# Patient Record
Sex: Male | Born: 1955 | Race: White | Hispanic: No | Marital: Married | Smoking: Current some day smoker
Health system: Southern US, Community
[De-identification: ages and names within clinical notes are randomized; demographics above are authoritative.]

## PROBLEM LIST (undated history)

## (undated) DIAGNOSIS — H269 Unspecified cataract: Secondary | ICD-10-CM

## (undated) HISTORY — PX: TESTICLE BIOPSY: SHX471

## (undated) HISTORY — DX: Unspecified cataract: H26.9

## (undated) HISTORY — PX: COLONOSCOPY: SHX174

## (undated) HISTORY — PX: CATARACT EXTRACTION: SUR2

---

## 2010-08-17 ENCOUNTER — Encounter: Payer: Self-pay | Admitting: Gastroenterology

## 2012-03-12 DIAGNOSIS — H269 Unspecified cataract: Secondary | ICD-10-CM

## 2012-03-12 HISTORY — DX: Unspecified cataract: H26.9

## 2012-04-23 ENCOUNTER — Emergency Department (HOSPITAL_COMMUNITY)
Admission: EM | Admit: 2012-04-23 | Discharge: 2012-04-23 | Disposition: A | Discharge status: Home / Self Care | Payer: BC Managed Care – PPO | Attending: Emergency Medicine | Admitting: Emergency Medicine

## 2012-04-23 ENCOUNTER — Emergency Department (HOSPITAL_COMMUNITY): Payer: BC Managed Care – PPO

## 2012-04-23 ENCOUNTER — Encounter (HOSPITAL_COMMUNITY): Payer: Self-pay | Admitting: *Deleted

## 2012-04-23 DIAGNOSIS — W010XXA Fall on same level from slipping, tripping and stumbling without subsequent striking against object, initial encounter: Secondary | ICD-10-CM | POA: Insufficient documentation

## 2012-04-23 DIAGNOSIS — M25532 Pain in left wrist: Secondary | ICD-10-CM

## 2012-04-23 DIAGNOSIS — S59909A Unspecified injury of unspecified elbow, initial encounter: Secondary | ICD-10-CM | POA: Insufficient documentation

## 2012-04-23 DIAGNOSIS — Y939 Activity, unspecified: Secondary | ICD-10-CM | POA: Insufficient documentation

## 2012-04-23 DIAGNOSIS — S6990XA Unspecified injury of unspecified wrist, hand and finger(s), initial encounter: Secondary | ICD-10-CM | POA: Insufficient documentation

## 2012-04-23 DIAGNOSIS — Y929 Unspecified place or not applicable: Secondary | ICD-10-CM | POA: Insufficient documentation

## 2012-04-23 MED ORDER — MELOXICAM 15 MG PO TABS: 15.0000 mg | ORAL_TABLET | Freq: Every day | ORAL | Status: AC

## 2012-04-23 NOTE — ED Notes (Signed)
Pt c/o  Lt wrist pain after he fell on a piece of ice today

## 2012-04-23 NOTE — ED Provider Notes (Signed)
History    This chart was scribed for non-physician practitioner working with Carleene Cooper III, MD by Sofie Rower, ED Scribe. This patient was seen in room TR08C/TR08C and the patient's care was started at 7:46PM.    CSN: [id number]  Arrival date & time 04/23/12  1191   First MD Initiated Contact with Patient 04/23/12 1946      Chief Complaint  Patient presents with  . Wrist Pain    (Consider location/radiation/quality/duration/timing/severity/associated sxs/prior treatment) The history is provided by the patient. No language interpreter was used.    Steven Burgess is a 57 y.o. male , who presents to the Emergency Department complaining of sudden, progressively worsening, non radiating wrist pain located at the left wrist, onset today (04/23/12). The pt reports he was exiting his truck earlier this afternoon, where he suddenly slipped and fell on a large sheet of ice, impacting directly upon his left wrist. Two hours after the falling incident, the pt began to experience a throbbing pain within his left wrist, generating his concern and desire to seek medical evaluation at [facility] this evening. The pt has not taken any medications to relieve his left wrist pain PTA. Modifying factors include certain movements and positions of the left wrist which intensifies the pain.  The pt denies left elbow pain, left shoulder pain, abdominal pain, and back pain. Furthermore, the pt denies any hx of major medical problems.  The pt does not smoke or drink alcohol.   Pt does not have an Orthopedist at present time.    History reviewed. No pertinent past medical history.  History reviewed. No pertinent past surgical history.  No family history on file.  History  Substance Use Topics  . Smoking status: Never Smoker   . Smokeless tobacco: Not on file  . Alcohol Use: No      Review of Systems  10 Systems reviewed and all are negative for acute change except as noted in the HPI.    Allergies   Review of patient's allergies indicates not on file.  Home Medications  No current outpatient prescriptions on file.  BP 131/89  Pulse 62  Temp(Src) 97.8 F (36.6 C) (Oral)  SpO2 98%  Physical Exam  Nursing note and vitals reviewed. Constitutional: He is oriented to person, place, and time. He appears well-developed and well-nourished. No distress.  HENT:  Head: Normocephalic and atraumatic.  Eyes: EOM are normal.  Neck: Neck supple. No tracheal deviation present.  Cardiovascular: Normal rate, regular rhythm and normal heart sounds.  Exam reveals no gallop and no friction rub.   No murmur heard. Pulmonary/Chest: Effort normal and breath sounds normal. No respiratory distress.  Abdominal: Soft. Bowel sounds are normal. There is no tenderness.  Musculoskeletal: Normal range of motion.       Left wrist: He exhibits bony tenderness.  Left wrist: Swelling and tenderness on the dorsolateral surface of the left wrist. No ecchymosis. No acute deformity present. X-ray reveals NML results. Full ROM of the left wrist with pain with wrist inversion. Pain with abduction and adduction of the 5th digit. No elbow tenderness. Full ROM, flexion, extension, supination, pronation are normal. Pt presents with a minor discomfort which has resolved.   Neurological: He is alert and oriented to person, place, and time.  Skin: Skin is warm and dry.  Psychiatric: He has a normal mood and affect. His behavior is normal.    ED Course  Procedures (including critical care time)  DIAGNOSTIC STUDIES: Oxygen Saturation is 98%  on room air, normal by my interpretation.    COORDINATION OF CARE:  8:15 PM- Treatment plan discussed with patient. Pt agrees with treatment.      Labs Reviewed - No data to display Dg Wrist Complete Left  04/23/2012  *RADIOLOGY REPORT*  Clinical Data: Fall on ice.  Pain.  LEFT WRIST - COMPLETE 3+ VIEW  Comparison: None available.  Findings: Left wrist is located.  No acute bone or  soft tissue abnormalities present.  IMPRESSION: Negative left wrist.   Original Report Authenticated By: Marin Roberts, M.D.      1. Wrist pain, acute, left       MDM  Patient without acute fracture or dislocation.  Wrist splint,  Supportive care and antiinflammatory given. Return precuations discussed and ortho follow up.    I personally performed the services described in this documentation, which was scribed in my presence. The recorded information has been reviewed and is accurate.     Arthor Captain, PA-C 04/23/12 2137

## 2012-04-23 NOTE — Progress Notes (Signed)
Orthopedic Tech Progress Note Patient Details:  Steven Burgess January 30, 1956 [id number]  Ortho Devices Type of Ortho Device: Velcro wrist splint Ortho Device/Splint Location: left wrist Ortho Device/Splint Interventions: Application   Nikki Dom 04/23/2012, 8:29 PM

## 2012-04-24 NOTE — ED Provider Notes (Signed)
Medical screening examination/treatment/procedure(s) were performed by non-physician practitioner and as supervising physician I was immediately available for consultation/collaboration.   Ronzell Laban III, MD 04/24/12 1110 

## 2013-05-29 ENCOUNTER — Encounter: Payer: Self-pay | Admitting: Gastroenterology

## 2013-06-12 IMAGING — CR DG WRIST COMPLETE 3+V*L*
4 series · 4 of 4 positions shown · non-contrast
Comparison: None available.

CLINICAL DATA: Fall on ice.  Pain.

LEFT WRIST - COMPLETE 3+ VIEW

[x wrist pa left]
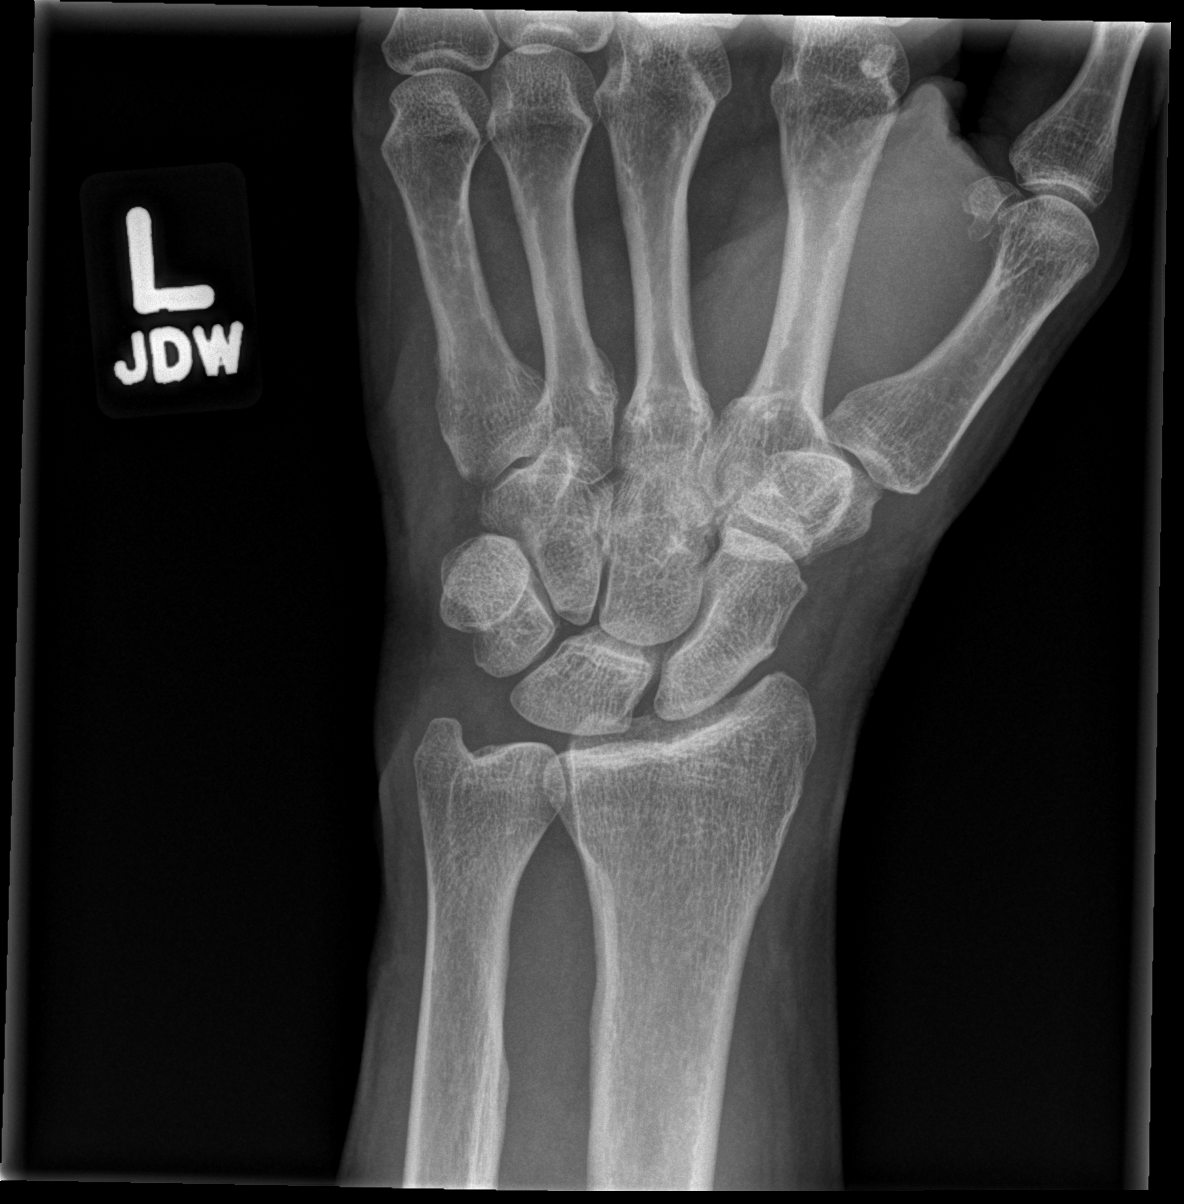

[x wrist obl left]
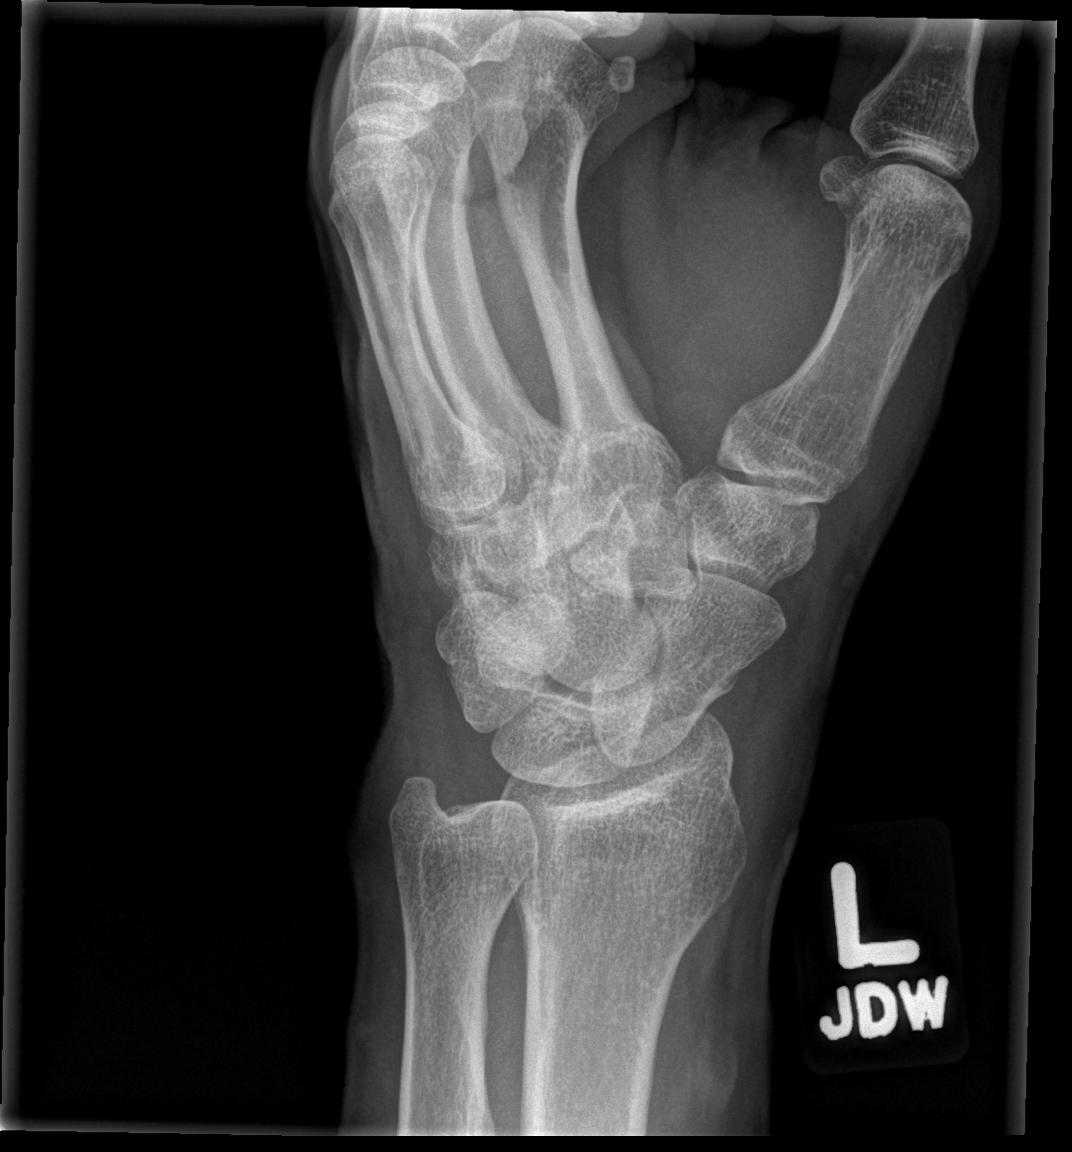

[x wrist lat left]
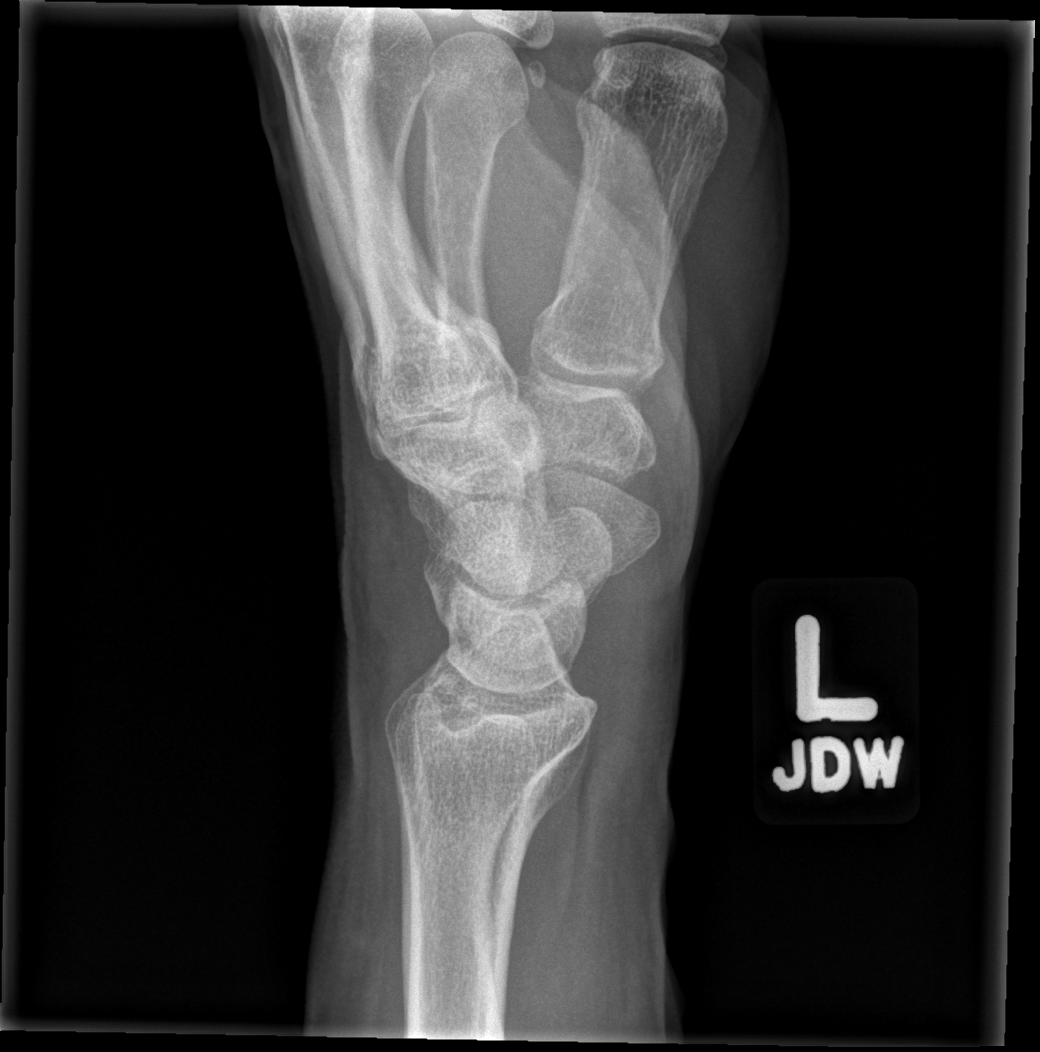

[x wrist navicular view left]
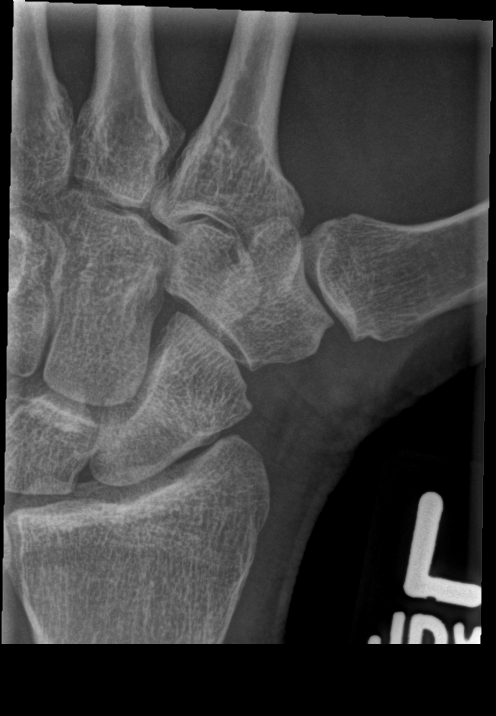

[4 of 4 positions shown; findings below may reference images not displayed]

FINDINGS: Left wrist is located.  No acute bone or soft tissue
abnormalities present.
IMPRESSION: Negative left wrist.

## 2014-05-14 ENCOUNTER — Encounter: Payer: Self-pay | Admitting: Gastroenterology

## 2014-09-09 ENCOUNTER — Ambulatory Visit (INDEPENDENT_AMBULATORY_CARE_PROVIDER_SITE_OTHER): Payer: BLUE CROSS/BLUE SHIELD | Admitting: Physician Assistant

## 2014-09-09 VITALS — BP 108/78 | HR 65 | Temp 97.4°F | Resp 16 | Ht 75.0 in | Wt 205.2 lb

## 2014-09-09 DIAGNOSIS — J069 Acute upper respiratory infection, unspecified: Secondary | ICD-10-CM | POA: Diagnosis not present

## 2014-09-09 MED ORDER — IPRATROPIUM BROMIDE 0.03 % NA SOLN: 2.0000 | Freq: Two times a day (BID) | NASAL | Status: AC

## 2014-09-09 MED ORDER — BENZONATATE 100 MG PO CAPS: 100.0000 mg | ORAL_CAPSULE | Freq: Three times a day (TID) | ORAL | Status: AC | PRN

## 2014-09-09 NOTE — Progress Notes (Signed)
   Subjective:    Patient ID: Steven Burgess, male    DOB: 03-Aug-1955, 59 y.o.   MRN: [id number]  HPI Patient presents for nasal congestion and cough that has been present for past 8 days. Says that he was feeling a little better over the weekend, but then developed a fever of 101 degrees at the beginning of the week. Cough has been sometimes productive. Endorses rhinorrhea, but denies sinus pressure, HA, otalgia, SOB/CP, N/V, sneezing, or wheezing. Denies h/o asthma or allergies. Smokes cigars occasionally. No known sick contacts. NKDA.   Review of Systems As noted above.    Objective:   Physical Exam  Constitutional: He is oriented to person, place, and time. He appears well-developed and well-nourished. No distress.  Blood pressure 108/78, pulse 65, temperature 97.4 F (36.3 C), temperature source Oral, resp. rate 16, height 6\' 3"  (1.905 m), weight 205 lb 3.2 oz (93.078 kg), SpO2 100 %.  HENT:  Head: Normocephalic and atraumatic.  Right Ear: Tympanic membrane, external ear and ear canal normal.  Left Ear: Tympanic membrane, external ear and ear canal normal.  Nose: Rhinorrhea (with moderate erythema) present. No mucosal edema. Right sinus exhibits no maxillary sinus tenderness and no frontal sinus tenderness. Left sinus exhibits no maxillary sinus tenderness and no frontal sinus tenderness.  Mouth/Throat: Uvula is midline, oropharynx is clear and moist and mucous membranes are normal. No oropharyngeal exudate, posterior oropharyngeal edema or posterior oropharyngeal erythema.  Eyes: Conjunctivae are normal. Pupils are equal, round, and reactive to light. Right eye exhibits no discharge. Left eye exhibits no discharge. No scleral icterus.  Neck: Normal range of motion. Neck supple. No thyromegaly present.  Cardiovascular: Normal rate, regular rhythm and normal heart sounds.  Exam reveals no gallop and no friction rub.   No murmur heard. Pulmonary/Chest: Effort normal and breath sounds  normal. No respiratory distress. He has no wheezes. He has no rales. He exhibits no tenderness.  Abdominal: Soft. Bowel sounds are normal. He exhibits no distension and no mass. There is no tenderness. There is no rebound and no guarding.  Lymphadenopathy:    He has no cervical adenopathy.  Neurological: He is alert and oriented to person, place, and time.  Skin: Skin is warm and dry. No rash noted. He is not diaphoretic. No erythema. No pallor.       Assessment & Plan:  1. Acute upper respiratory infection Plenty of water and rest. - benzonatate (TESSALON) 100 MG capsule; Take 1-2 capsules (100-200 mg total) by mouth 3 (three) times daily as needed for cough.  Dispense: 40 capsule; Refill: 0 - ipratropium (ATROVENT) 0.03 % nasal spray; Place 2 sprays into both nostrils 2 (two) times daily.  Dispense: 30 mL; Refill: 0   Luis Sami PA-C  [facility] 09/09/2014 1:09 PM

## 2014-09-09 NOTE — Patient Instructions (Signed)
Upper Respiratory Infection, Adult An upper respiratory infection (URI) is also sometimes known as the common cold. The upper respiratory tract includes the nose, sinuses, throat, trachea, and bronchi. Bronchi are the airways leading to the lungs. Most people improve within 1 week, but symptoms can last up to 2 weeks. A residual cough may last even longer.  CAUSES Many different viruses can infect the tissues lining the upper respiratory tract. The tissues become irritated and inflamed and often become very moist. Mucus production is also common. A cold is contagious. You can easily spread the virus to others by oral contact. This includes kissing, sharing a glass, coughing, or sneezing. Touching your mouth or nose and then touching a surface, which is then touched by another person, can also spread the virus. SYMPTOMS  Symptoms typically develop 1 to 3 days after you come in contact with a cold virus. Symptoms vary from person to person. They may include:  Runny nose.  Sneezing.  Nasal congestion.  Sinus irritation.  Sore throat.  Loss of voice (laryngitis).  Cough.  Fatigue.  Muscle aches.  Loss of appetite.  Headache.  Low-grade fever. DIAGNOSIS  You might diagnose your own cold based on familiar symptoms, since most people get a cold 2 to 3 times a year. Your caregiver can confirm this based on your exam. Most importantly, your caregiver can check that your symptoms are not due to another disease such as strep throat, sinusitis, pneumonia, asthma, or epiglottitis. Blood tests, throat tests, and X-rays are not necessary to diagnose a common cold, but they may sometimes be helpful in excluding other more serious diseases. Your caregiver will decide if any further tests are required. RISKS AND COMPLICATIONS  You may be at risk for a more severe case of the common cold if you smoke cigarettes, have chronic heart disease (such as heart failure) or lung disease (such as asthma), or if  you have a weakened immune system. The very young and very old are also at risk for more serious infections. Bacterial sinusitis, middle ear infections, and bacterial pneumonia can complicate the common cold. The common cold can worsen asthma and chronic obstructive pulmonary disease (COPD). Sometimes, these complications can require emergency medical care and may be life-threatening. PREVENTION  The best way to protect against getting a cold is to practice good hygiene. Avoid oral or hand contact with people with cold symptoms. Wash your hands often if contact occurs. There is no clear evidence that vitamin C, vitamin E, echinacea, or exercise reduces the chance of developing a cold. However, it is always recommended to get plenty of rest and practice good nutrition. TREATMENT  Treatment is directed at relieving symptoms. There is no cure. Antibiotics are not effective, because the infection is caused by a virus, not by bacteria. Treatment may include:  Increased fluid intake. Sports drinks offer valuable electrolytes, sugars, and fluids.  Breathing heated mist or steam (vaporizer or shower).  Eating chicken soup or other clear broths, and maintaining good nutrition.  Getting plenty of rest.  Using gargles or lozenges for comfort.  Controlling fevers with ibuprofen or acetaminophen as directed by your caregiver.  Increasing usage of your inhaler if you have asthma. Zinc gel and zinc lozenges, taken in the first 24 hours of the common cold, can shorten the duration and lessen the severity of symptoms. Pain medicines may help with fever, muscle aches, and throat pain. A variety of non-prescription medicines are available to treat congestion and runny nose. Your caregiver   can make recommendations and may suggest nasal or lung inhalers for other symptoms.  HOME CARE INSTRUCTIONS   Only take over-the-counter or prescription medicines for pain, discomfort, or fever as directed by your  caregiver.  Use a warm mist humidifier or inhale steam from a shower to increase air moisture. This may keep secretions moist and make it easier to breathe.  Drink enough water and fluids to keep your urine clear or pale yellow.  Rest as needed.  Return to work when your temperature has returned to normal or as your caregiver advises. You may need to stay home longer to avoid infecting others. You can also use a face mask and careful hand washing to prevent spread of the virus. SEEK MEDICAL CARE IF:   After the first few days, you feel you are getting worse rather than better.  You need your caregiver's advice about medicines to control symptoms.  You develop chills, worsening shortness of breath, or brown or red sputum. These may be signs of pneumonia.  You develop yellow or brown nasal discharge or pain in the face, especially when you bend forward. These may be signs of sinusitis.  You develop a fever, swollen neck glands, pain with swallowing, or white areas in the back of your throat. These may be signs of strep throat. SEEK IMMEDIATE MEDICAL CARE IF:   You have a fever.  You develop severe or persistent headache, ear pain, sinus pain, or chest pain.  You develop wheezing, a prolonged cough, cough up blood, or have a change in your usual mucus (if you have chronic lung disease).  You develop sore muscles or a stiff neck. Document Released: 08/22/2000 Document Revised: 05/21/2011 Document Reviewed: 06/03/2013 ExitCare Patient Information 2015 [employer]. This information is not intended to replace advice given to you by your health care provider. Make sure you discuss any questions you have with your health care provider.  

## 2014-09-11 NOTE — Progress Notes (Signed)
  Medical screening examination/treatment/procedure(s) were performed by non-physician practitioner and as supervising physician I was immediately available for consultation/collaboration.     

## 2015-12-21 ENCOUNTER — Encounter: Payer: Self-pay | Admitting: Gastroenterology

## 2016-01-24 ENCOUNTER — Ambulatory Visit (AMBULATORY_SURGERY_CENTER): Payer: Self-pay

## 2016-01-24 VITALS — Ht 75.0 in | Wt 208.4 lb

## 2016-01-24 DIAGNOSIS — Z1211 Encounter for screening for malignant neoplasm of colon: Secondary | ICD-10-CM

## 2016-01-24 MED ORDER — NA SULFATE-K SULFATE-MG SULF 17.5-3.13-1.6 GM/177ML PO SOLN: ORAL | 0 refills | Status: DC

## 2016-01-24 NOTE — Progress Notes (Signed)
Per pt, no allergies to soy or egg products.Pt not taking any weight loss meds or using  O2 at home. 

## 2016-01-31 ENCOUNTER — Encounter: Payer: Self-pay | Admitting: Gastroenterology

## 2016-02-14 ENCOUNTER — Ambulatory Visit (AMBULATORY_SURGERY_CENTER): Payer: BLUE CROSS/BLUE SHIELD | Admitting: Gastroenterology

## 2016-02-14 ENCOUNTER — Encounter: Payer: Self-pay | Admitting: Gastroenterology

## 2016-02-14 VITALS — BP 121/68 | HR 63 | Temp 96.9°F | Resp 8 | Ht 75.0 in | Wt 208.0 lb

## 2016-02-14 DIAGNOSIS — Z1211 Encounter for screening for malignant neoplasm of colon: Secondary | ICD-10-CM

## 2016-02-14 DIAGNOSIS — Z1212 Encounter for screening for malignant neoplasm of rectum: Secondary | ICD-10-CM | POA: Diagnosis not present

## 2016-02-14 MED ORDER — SODIUM CHLORIDE 0.9 % IV SOLN: 500.0000 mL | INTRAVENOUS | Status: AC

## 2016-02-14 NOTE — Patient Instructions (Signed)
YOU HAD AN ENDOSCOPIC PROCEDURE TODAY AT [facility]:   Refer to the procedure report that was given to you for any specific questions about what was found during the examination.  If the procedure report does not answer your questions, please call your gastroenterologist to clarify.  If you requested that your care partner not be given the details of your procedure findings, then the procedure report has been included in a sealed envelope for you to review at your convenience later.  YOU SHOULD EXPECT: Some feelings of bloating in the abdomen. Passage of more gas than usual.  Walking can help get rid of the air that was put into your GI tract during the procedure and reduce the bloating. If you had a lower endoscopy (such as a colonoscopy or flexible sigmoidoscopy) you may notice spotting of blood in your stool or on the toilet paper. If you underwent a bowel prep for your procedure, you may not have a normal bowel movement for a few days.  Please Note:  You might notice some irritation and congestion in your nose or some drainage.  This is from the oxygen used during your procedure.  There is no need for concern and it should clear up in a day or so.  SYMPTOMS TO REPORT IMMEDIATELY:   Following lower endoscopy (colonoscopy or flexible sigmoidoscopy):  Excessive amounts of blood in the stool  Significant tenderness or worsening of abdominal pains  Swelling of the abdomen that is new, acute  Fever of 100F or higher  For urgent or emergent issues, a gastroenterologist can be reached at any hour by calling [phone].   DIET:  We do recommend a small meal at first, but then you may proceed to your regular diet.  Drink plenty of fluids but you should avoid alcoholic beverages for 24 hours.  ACTIVITY:  You should plan to take it easy for the rest of today and you should NOT DRIVE or use heavy machinery until tomorrow (because of the sedation medicines used during the test).     FOLLOW UP: Our staff will call the number listed on your records the next business day following your procedure to check on you and address any questions or concerns that you may have regarding the information given to you following your procedure. If we do not reach you, we will leave a message.  However, if you are feeling well and you are not experiencing any problems, there is no need to return our call.  We will assume that you have returned to your regular daily activities without incident.  Repeat Colonoscopy in 10 years 2027  If any biopsies were taken you will be contacted by phone or by letter within the next 1-3 weeks.  Please call us at [phone] if you have not heard about the biopsies in 3 weeks.    SIGNATURES/CONFIDENTIALITY: You and/or your care partner have signed paperwork which will be entered into your electronic medical record.  These signatures attest to the fact that that the information above on your After Visit Summary has been reviewed and is understood.  Full responsibility of the confidentiality of this discharge information lies with you and/or your care-partner.

## 2016-02-14 NOTE — Op Note (Signed)
[facility] Patient Name: Steven Burgess Procedure Date: 02/14/2016 10:26 AM MRN: [id number] Endoscopist: Mallie Mussel L. Loletha Carrow , MD Age: 60 Referring MD:  Date of Birth: [birth year] Gender: Male Account #: [id number] Procedure:                Colonoscopy Indications:              Screening for colorectal malignant neoplasm (no                            polyps on colonoscopy 07/2003) Medicines:                Monitored Anesthesia Care Procedure:                Pre-Anesthesia Assessment:                           - Prior to the procedure, a History and Physical                            was performed, and patient medications and                            allergies were reviewed. The patient's tolerance of                            previous anesthesia was also reviewed. The risks                            and benefits of the procedure and the sedation                            options and risks were discussed with the patient.                            All questions were answered, and informed consent                            was obtained. Prior Anticoagulants: The patient has                            taken no previous anticoagulant or antiplatelet                            agents. ASA Grade Assessment: I - A normal, healthy                            patient. After reviewing the risks and benefits,                            the patient was deemed in satisfactory condition to                            undergo the procedure.  After obtaining informed consent, the colonoscope                            was passed under direct vision. Throughout the                            procedure, the patient's blood pressure, pulse, and                            oxygen saturations were monitored continuously. The                            Model CF-HQ190L [id number]) scope was introduced                            through the anus and advanced to the the cecum,                             identified by appendiceal orifice and ileocecal                            valve. The colonoscopy was performed without                            difficulty. The patient tolerated the procedure                            well. The quality of the bowel preparation was                            excellent. The ileocecal valve, appendiceal                            orifice, and rectum were photographed. The quality                            of the bowel preparation was evaluated using the                            BBPS [facility] Bowel Preparation Scale) with scores                            of: Right Colon = 3, Transverse Colon = 3 and Left                            Colon = 3 (entire mucosa seen well with no residual                            staining, small fragments of stool or opaque                            liquid). The total BBPS score equals 9. The bowel  preparation used was SUPREP. Scope In: 10:46:33 AM Scope Out: 11:00:25 AM Scope Withdrawal Time: 0 hours 8 minutes 45 seconds  Total Procedure Duration: 0 hours 13 minutes 52 seconds  Findings:                 The perianal and digital rectal examinations were                            normal.                           A few medium-mouthed diverticula were found in the                            left colon.                           Internal hemorrhoids were found during retroflexion                            and during anoscopy. The hemorrhoids were                            medium-sized and Grade I (internal hemorrhoids that                            do not prolapse).                           The exam was otherwise without abnormality on                            direct and retroflexion views. Complications:            No immediate complications. Estimated Blood Loss:     Estimated blood loss: none. Impression:               - Diverticulosis in the left colon.                            - Internal hemorrhoids.                           - The examination was otherwise normal on direct                            and retroflexion views.                           - No specimens collected. Recommendation:           - Patient has a contact number available for                            emergencies. The signs and symptoms of potential                            delayed complications were discussed with the  patient. Return to normal activities tomorrow.                            Written discharge instructions were provided to the                            patient.                           - Resume previous diet.                           - Continue present medications.                           - Repeat colonoscopy in 10 years for screening                            purposes. Tomaz Janis L. Loletha Carrow, MD 02/14/2016 11:04:33 AM This report has been signed electronically.

## 2016-02-14 NOTE — Progress Notes (Signed)
Report given to PACU RN, vss 

## 2016-02-15 ENCOUNTER — Telehealth: Payer: Self-pay

## 2016-02-15 ENCOUNTER — Telehealth: Payer: Self-pay | Admitting: *Deleted

## 2016-02-15 NOTE — Telephone Encounter (Signed)
Message left

## 2016-02-15 NOTE — Telephone Encounter (Signed)
  Follow up Call-  Call back number 02/14/2016  Post procedure Call Back phone  # [phone]  Permission to leave phone message Yes  Some recent data might be hidden     Patient questions:  Do you have a fever, pain , or abdominal swelling? No. Pain Score  0 *  Have you tolerated food without any problems? Yes.    Have you been able to return to your normal activities? Yes.    Do you have any questions about your discharge instructions: Diet   No. Medications  No. Follow up visit  No.  Do you have questions or concerns about your Care? No.  Actions: * If pain score is 4 or above: No action needed, pain <4.

## 2018-05-21 DIAGNOSIS — H9193 Unspecified hearing loss, bilateral: Secondary | ICD-10-CM | POA: Diagnosis not present

## 2018-05-21 DIAGNOSIS — Z0000 Encounter for general adult medical examination without abnormal findings: Secondary | ICD-10-CM | POA: Diagnosis not present

## 2018-05-21 DIAGNOSIS — Z125 Encounter for screening for malignant neoplasm of prostate: Secondary | ICD-10-CM | POA: Diagnosis not present

## 2018-05-21 DIAGNOSIS — Z23 Encounter for immunization: Secondary | ICD-10-CM | POA: Diagnosis not present

## 2018-05-21 DIAGNOSIS — Z1159 Encounter for screening for other viral diseases: Secondary | ICD-10-CM | POA: Diagnosis not present

## 2018-05-21 DIAGNOSIS — E782 Mixed hyperlipidemia: Secondary | ICD-10-CM | POA: Diagnosis not present

## 2019-04-20 ENCOUNTER — Ambulatory Visit: Payer: Medicare Other | Attending: Internal Medicine

## 2019-04-20 DIAGNOSIS — Z23 Encounter for immunization: Secondary | ICD-10-CM

## 2019-04-20 NOTE — Progress Notes (Signed)
   Covid-19 Vaccination Clinic  Name:  Steven Burgess    MRN: [id number] DOB: 08-20-1955  04/20/2019  [name] was observed post Covid-19 immunization for 15 minutes without incidence. He was provided with Vaccine Information Sheet and instruction to access the V-Safe system.   [name] was instructed to call 911 with any severe reactions post vaccine: Marland Kitchen Difficulty breathing  . Swelling of your face and throat  . A fast heartbeat  . A bad rash all over your body  . Dizziness and weakness    Immunizations Administered    Name Date Dose VIS Date Route   Pfizer COVID-19 Vaccine 04/20/2019  4:50 PM 0.3 mL 02/20/2019 Intramuscular   Manufacturer: [place]   Lot: [id number]   [place]: [id number]

## 2019-05-15 ENCOUNTER — Ambulatory Visit: Payer: Medicare Other | Attending: Internal Medicine

## 2019-05-15 ENCOUNTER — Other Ambulatory Visit: Payer: Self-pay

## 2019-05-15 DIAGNOSIS — Z23 Encounter for immunization: Secondary | ICD-10-CM

## 2019-05-15 NOTE — Progress Notes (Signed)
   Covid-19 Vaccination Clinic  Name:  Steven Burgess    MRN: [id number] DOB: [birth year]  05/15/2019  [name] was observed post Covid-19 immunization for 15 minutes without incident. He was provided with Vaccine Information Sheet and instruction to access the V-Safe system.   [name] was instructed to call 911 with any severe reactions post vaccine: Marland Kitchen Difficulty breathing  . Swelling of face and throat  . A fast heartbeat  . A bad rash all over body  . Dizziness and weakness   Immunizations Administered    Name Date Dose VIS Date Route   Pfizer COVID-19 Vaccine 05/15/2019  2:48 PM 0.3 mL 02/20/2019 Intramuscular   Manufacturer: [place]   Lot: [id number]   [place]: [id number]

## 2019-05-28 DIAGNOSIS — Z7189 Other specified counseling: Secondary | ICD-10-CM | POA: Diagnosis not present

## 2019-05-28 DIAGNOSIS — E782 Mixed hyperlipidemia: Secondary | ICD-10-CM | POA: Diagnosis not present

## 2019-05-28 DIAGNOSIS — M67431 Ganglion, right wrist: Secondary | ICD-10-CM | POA: Diagnosis not present

## 2019-05-28 DIAGNOSIS — Z125 Encounter for screening for malignant neoplasm of prostate: Secondary | ICD-10-CM | POA: Diagnosis not present

## 2019-05-28 DIAGNOSIS — Z0000 Encounter for general adult medical examination without abnormal findings: Secondary | ICD-10-CM | POA: Diagnosis not present

## 2019-05-28 DIAGNOSIS — F419 Anxiety disorder, unspecified: Secondary | ICD-10-CM | POA: Diagnosis not present

## 2019-07-30 DIAGNOSIS — R2231 Localized swelling, mass and lump, right upper limb: Secondary | ICD-10-CM | POA: Diagnosis not present

## 2019-07-30 DIAGNOSIS — M25531 Pain in right wrist: Secondary | ICD-10-CM | POA: Diagnosis not present

## 2019-07-30 DIAGNOSIS — M67431 Ganglion, right wrist: Secondary | ICD-10-CM | POA: Diagnosis not present

## 2019-08-14 DIAGNOSIS — S46299A Other injury of muscle, fascia and tendon of other parts of biceps, unspecified arm, initial encounter: Secondary | ICD-10-CM | POA: Diagnosis not present

## 2019-08-14 DIAGNOSIS — D2111 Benign neoplasm of connective and other soft tissue of right upper limb, including shoulder: Secondary | ICD-10-CM | POA: Diagnosis not present

## 2019-08-14 DIAGNOSIS — G8918 Other acute postprocedural pain: Secondary | ICD-10-CM | POA: Diagnosis not present

## 2019-08-14 DIAGNOSIS — D481 Neoplasm of uncertain behavior of connective and other soft tissue: Secondary | ICD-10-CM | POA: Diagnosis not present

## 2019-12-10 DIAGNOSIS — Z23 Encounter for immunization: Secondary | ICD-10-CM | POA: Diagnosis not present

## 2020-04-15 DIAGNOSIS — B029 Zoster without complications: Secondary | ICD-10-CM | POA: Diagnosis not present

## 2020-07-05 DIAGNOSIS — Z20822 Contact with and (suspected) exposure to covid-19: Secondary | ICD-10-CM | POA: Diagnosis not present

## 2021-04-25 DIAGNOSIS — H25041 Posterior subcapsular polar age-related cataract, right eye: Secondary | ICD-10-CM | POA: Diagnosis not present

## 2021-12-27 DIAGNOSIS — Z23 Encounter for immunization: Secondary | ICD-10-CM | POA: Diagnosis not present

## 2022-02-07 DIAGNOSIS — Z23 Encounter for immunization: Secondary | ICD-10-CM | POA: Diagnosis not present

## 2022-05-08 DIAGNOSIS — Z125 Encounter for screening for malignant neoplasm of prostate: Secondary | ICD-10-CM | POA: Diagnosis not present

## 2022-05-08 DIAGNOSIS — Z131 Encounter for screening for diabetes mellitus: Secondary | ICD-10-CM | POA: Diagnosis not present

## 2022-05-08 DIAGNOSIS — Z0000 Encounter for general adult medical examination without abnormal findings: Secondary | ICD-10-CM | POA: Diagnosis not present

## 2022-05-08 DIAGNOSIS — E7800 Pure hypercholesterolemia, unspecified: Secondary | ICD-10-CM | POA: Diagnosis not present

## 2022-05-08 DIAGNOSIS — Z13220 Encounter for screening for lipoid disorders: Secondary | ICD-10-CM | POA: Diagnosis not present

## 2022-05-08 DIAGNOSIS — L989 Disorder of the skin and subcutaneous tissue, unspecified: Secondary | ICD-10-CM | POA: Diagnosis not present

## 2022-05-08 DIAGNOSIS — Z23 Encounter for immunization: Secondary | ICD-10-CM | POA: Diagnosis not present

## 2022-05-09 ENCOUNTER — Other Ambulatory Visit: Payer: Self-pay | Admitting: Family Medicine

## 2022-05-09 DIAGNOSIS — E7800 Pure hypercholesterolemia, unspecified: Secondary | ICD-10-CM

## 2022-05-30 ENCOUNTER — Ambulatory Visit (INDEPENDENT_AMBULATORY_CARE_PROVIDER_SITE_OTHER): Payer: Medicare Other | Admitting: Dermatology

## 2022-05-30 ENCOUNTER — Encounter: Payer: Self-pay | Admitting: Dermatology

## 2022-05-30 DIAGNOSIS — L720 Epidermal cyst: Secondary | ICD-10-CM

## 2022-05-30 DIAGNOSIS — Z1283 Encounter for screening for malignant neoplasm of skin: Secondary | ICD-10-CM | POA: Diagnosis not present

## 2022-05-30 DIAGNOSIS — L821 Other seborrheic keratosis: Secondary | ICD-10-CM | POA: Diagnosis not present

## 2022-05-30 DIAGNOSIS — D1800 Hemangioma unspecified site: Secondary | ICD-10-CM

## 2022-05-30 NOTE — Progress Notes (Signed)
   New Patient Visit  Subjective  Steven Burgess is a 67 y.o. male who presents for the following: Annual Exam (C/o occasionally the left side of nose will get a pimple type bump. Comes and goes. //Skin on scalp is dry and itchy. Plays golf often but not too good at applying sunscreen. But wears hat //Red skin tags on chest, noticed a few brown and textured spots on neck. Last dermatology appointment about 10 years ).    Objective  Well appearing patient in no apparent distress; mood and affect are within normal limits.  A full examination was performed including scalp, head, eyes, ears, nose, lips, neck, chest, axillae, abdomen, back, buttocks, bilateral upper extremities, bilateral lower extremities, hands, feet, fingers, toes, fingernails, and toenails. All findings within normal limits unless otherwise noted below.  A dermatoscope was used during the exam.  The following people were also present during my examination: , my medical assistant (male)   Left Forearm - Anterior Stuck-on verrucous, tan-brown papules and plaques.  Mid Tip of Nose Red papule   Assessment & Plan  Seborrheic keratosis Left Forearm - Anterior  I counseled the patient regarding the following: Skin Care: Seborrheic Keratoses are benign. No treatment is necessary. Expectations: Seborrheic Keratoses are benign warty growths. Patients get more of them as they age.   Hemangioma, unspecified site Mid Tip of Nose  Electrodesiccation of lesion on nose    Lentigines - Scattered tan macules - Due to sun exposure - Benign-appearing, observe - Recommend daily broad spectrum sunscreen SPF 30+ to sun-exposed areas, reapply every 2 hours as needed. - Call for any changes  Seborrheic Keratoses - Stuck-on, waxy, tan-brown papules and/or plaques  - Benign-appearing - Discussed benign etiology and prognosis. - Observe - Call for any changes  Melanocytic Nevi - Tan-brown and/or pink-flesh-colored  symmetric macules and papules - Benign appearing on exam today - Observation - Call clinic for new or changing moles - Recommend daily use of broad spectrum spf 30+ sunscreen to sun-exposed areas.   Hemangiomas - Red papules - Discussed benign nature - Observe - Call for any changes  Actinic Damage - Chronic condition, secondary to cumulative UV/sun exposure - diffuse scaly erythematous macules with underlying dyspigmentation - Recommend daily broad spectrum sunscreen SPF 30+ to sun-exposed areas, reapply every 2 hours as needed.  - Staying in the shade or wearing long sleeves, sun glasses (UVA+UVB protection) and wide brim hats (4-inch brim around the entire circumference of the hat) are also recommended for sun protection.  - Call for new or changing lesions.  Skin cancer screening performed today.  Return in about 1 year (around 05/30/2023) for Skin check.  Documentation: I have reviewed the above documentation for accuracy and completeness, and I agree with the above  [place], DO

## 2022-05-30 NOTE — Patient Instructions (Addendum)
Due to recent changes in healthcare laws, you may see results of your pathology and/or laboratory studies on MyChart before the doctors have had a chance to review them. We understand that in some cases there may be results that are confusing or concerning to you. Please understand that not all results are received at the same time and often the doctors may need to interpret multiple results in order to provide you with the best plan of care or course of treatment. Therefore, we ask that you please give [place] 2 business days to thoroughly review all your results before contacting the office for clarification. Should we see a critical lab result, you will be contacted sooner.   If You Need Anything After Your Visit  If you have any questions or concerns for your doctor, please call our main line at [phone] If no one answers, please leave a voicemail as directed and we will return your call as soon as possible. Messages left after 4 pm will be answered the following business day.   You may also send [place] a message via [place]. We typically respond to MyChart messages within 1-2 business days.  For prescription refills, please ask your pharmacy to contact our office. Our fax number is [phone].  If you have an urgent issue when the clinic is closed that cannot wait until the next business day, you can page your doctor at the number below.    Please note that while we do our best to be available for urgent issues outside of office hours, we are not available 24/7.   If you have an urgent issue and are unable to reach [place], you may choose to seek medical care at your doctor's office, retail clinic, urgent care center, or emergency room.  If you have a medical emergency, please immediately call 911 or go to the emergency department. In the event of inclement weather, please call our main line at [phone] for an update on the status of any delays or closures.  Dermatology Medication Tips: Please  keep the boxes that topical medications come in in order to help keep track of the instructions about where and how to use these. Pharmacies typically print the medication instructions only on the boxes and not directly on the medication tubes.   If your medication is too expensive, please contact our office at [phone] or send [place] a message through [place].   We are unable to tell what your co-pay for medications will be in advance as this is different depending on your insurance coverage. However, we may be able to find a substitute medication at lower cost or fill out paperwork to get insurance to cover a needed medication.   If a prior authorization is required to get your medication covered by your insurance company, please allow [place] 1-2 business days to complete this process.  Drug prices often vary depending on where the prescription is filled and some pharmacies may offer cheaper prices.  The website www.goodrx.com contains coupons for medications through different pharmacies. The prices here do not account for what the cost may be with help from insurance (it may be cheaper with your insurance), but the website can give you the price if you did not use any insurance.  - You can print the associated coupon and take it with your prescription to the pharmacy.  - You may also stop by our office during regular business hours and pick up a GoodRx coupon card.  - If you need your  prescription sent electronically to a different pharmacy, notify our office through [facility] or by phone at [phone]

## 2022-06-14 ENCOUNTER — Ambulatory Visit
Admission: RE | Admit: 2022-06-14 | Discharge: 2022-06-14 | Disposition: A | Discharge status: Home / Self Care | Payer: No Typology Code available for payment source | Source: Ambulatory Visit | Attending: Family Medicine | Admitting: Family Medicine

## 2022-06-14 DIAGNOSIS — E7800 Pure hypercholesterolemia, unspecified: Secondary | ICD-10-CM

## 2022-08-23 DIAGNOSIS — H02834 Dermatochalasis of left upper eyelid: Secondary | ICD-10-CM | POA: Diagnosis not present

## 2022-08-23 DIAGNOSIS — H02831 Dermatochalasis of right upper eyelid: Secondary | ICD-10-CM | POA: Diagnosis not present

## 2023-01-02 DIAGNOSIS — M25511 Pain in right shoulder: Secondary | ICD-10-CM | POA: Diagnosis not present

## 2023-01-02 DIAGNOSIS — M25512 Pain in left shoulder: Secondary | ICD-10-CM | POA: Diagnosis not present

## 2023-01-19 DIAGNOSIS — M25511 Pain in right shoulder: Secondary | ICD-10-CM | POA: Diagnosis not present

## 2023-02-28 DIAGNOSIS — Z23 Encounter for immunization: Secondary | ICD-10-CM | POA: Diagnosis not present

## 2023-05-30 DIAGNOSIS — E7800 Pure hypercholesterolemia, unspecified: Secondary | ICD-10-CM | POA: Diagnosis not present

## 2023-05-30 DIAGNOSIS — R931 Abnormal findings on diagnostic imaging of heart and coronary circulation: Secondary | ICD-10-CM | POA: Diagnosis not present

## 2023-05-30 DIAGNOSIS — Z125 Encounter for screening for malignant neoplasm of prostate: Secondary | ICD-10-CM | POA: Diagnosis not present

## 2023-05-30 DIAGNOSIS — L570 Actinic keratosis: Secondary | ICD-10-CM | POA: Diagnosis not present

## 2023-05-30 DIAGNOSIS — M25572 Pain in left ankle and joints of left foot: Secondary | ICD-10-CM | POA: Diagnosis not present

## 2023-05-30 DIAGNOSIS — Z0000 Encounter for general adult medical examination without abnormal findings: Secondary | ICD-10-CM | POA: Diagnosis not present

## 2023-05-30 DIAGNOSIS — Z131 Encounter for screening for diabetes mellitus: Secondary | ICD-10-CM | POA: Diagnosis not present

## 2023-06-20 ENCOUNTER — Other Ambulatory Visit: Payer: Self-pay | Admitting: Medical Genetics

## 2023-07-31 DIAGNOSIS — G8929 Other chronic pain: Secondary | ICD-10-CM | POA: Diagnosis not present

## 2023-07-31 DIAGNOSIS — M24272 Disorder of ligament, left ankle: Secondary | ICD-10-CM | POA: Diagnosis not present

## 2023-09-02 ENCOUNTER — Other Ambulatory Visit (HOSPITAL_COMMUNITY)
Admission: RE | Admit: 2023-09-02 | Discharge: 2023-09-02 | Disposition: A | Discharge status: Home / Self Care | Payer: Self-pay | Source: Ambulatory Visit | Attending: Medical Genetics | Admitting: Medical Genetics

## 2023-09-13 LAB — GENECONNECT MOLECULAR SCREEN: Genetic Analysis Overall Interpretation: NEGATIVE

## 2023-09-16 DIAGNOSIS — K4090 Unilateral inguinal hernia, without obstruction or gangrene, not specified as recurrent: Secondary | ICD-10-CM | POA: Diagnosis not present

## 2023-09-23 ENCOUNTER — Ambulatory Visit: Payer: Self-pay | Admitting: General Surgery

## 2023-09-23 DIAGNOSIS — K4090 Unilateral inguinal hernia, without obstruction or gangrene, not specified as recurrent: Secondary | ICD-10-CM | POA: Diagnosis not present

## 2023-10-11 ENCOUNTER — Encounter (HOSPITAL_BASED_OUTPATIENT_CLINIC_OR_DEPARTMENT_OTHER): Payer: Self-pay | Admitting: General Surgery

## 2023-10-11 ENCOUNTER — Other Ambulatory Visit: Payer: Self-pay

## 2023-10-16 NOTE — Progress Notes (Signed)

## 2023-10-21 ENCOUNTER — Ambulatory Visit (HOSPITAL_BASED_OUTPATIENT_CLINIC_OR_DEPARTMENT_OTHER)
Admission: RE | Admit: 2023-10-21 | Discharge: 2023-10-21 | Disposition: A | Discharge status: Home / Self Care | Attending: General Surgery | Admitting: General Surgery

## 2023-10-21 ENCOUNTER — Ambulatory Visit (HOSPITAL_BASED_OUTPATIENT_CLINIC_OR_DEPARTMENT_OTHER): Admitting: Certified Registered"

## 2023-10-21 ENCOUNTER — Other Ambulatory Visit: Payer: Self-pay

## 2023-10-21 ENCOUNTER — Encounter (HOSPITAL_BASED_OUTPATIENT_CLINIC_OR_DEPARTMENT_OTHER)
Admission: RE | Disposition: A | Discharge status: Home / Self Care | Payer: Self-pay | Source: Home / Self Care | Attending: General Surgery

## 2023-10-21 ENCOUNTER — Encounter (HOSPITAL_BASED_OUTPATIENT_CLINIC_OR_DEPARTMENT_OTHER): Payer: Self-pay | Admitting: General Surgery

## 2023-10-21 DIAGNOSIS — K4090 Unilateral inguinal hernia, without obstruction or gangrene, not specified as recurrent: Secondary | ICD-10-CM

## 2023-10-21 DIAGNOSIS — F17210 Nicotine dependence, cigarettes, uncomplicated: Secondary | ICD-10-CM | POA: Diagnosis not present

## 2023-10-21 DIAGNOSIS — G8918 Other acute postprocedural pain: Secondary | ICD-10-CM | POA: Diagnosis not present

## 2023-10-21 DIAGNOSIS — E785 Hyperlipidemia, unspecified: Secondary | ICD-10-CM | POA: Insufficient documentation

## 2023-10-21 SURGERY — REPAIR, HERNIA, INGUINAL, ADULT
Anesthesia: General | Site: Groin | Laterality: Right

## 2023-10-21 MED ORDER — FENTANYL CITRATE (PF) 100 MCG/2ML IJ SOLN
INTRAMUSCULAR | Status: AC
  Filled 2023-10-21: qty 2

## 2023-10-21 MED ORDER — ONDANSETRON HCL 4 MG/2ML IJ SOLN
INTRAMUSCULAR | Status: AC
  Filled 2023-10-21: qty 2

## 2023-10-21 MED ORDER — ACETAMINOPHEN 500 MG PO TABS
1000.0000 mg | ORAL_TABLET | ORAL | Status: AC
  Administered 2023-10-21 (×2): 1000 mg via ORAL

## 2023-10-21 MED ORDER — EPHEDRINE 5 MG/ML INJ
INTRAVENOUS | Status: AC
  Filled 2023-10-21: qty 5

## 2023-10-21 MED ORDER — HYDROMORPHONE HCL 1 MG/ML IJ SOLN: 0.2500 mg | INTRAMUSCULAR | Status: DC | PRN

## 2023-10-21 MED ORDER — FENTANYL CITRATE (PF) 100 MCG/2ML IJ SOLN
50.0000 ug | Freq: Once | INTRAMUSCULAR | Status: AC
  Administered 2023-10-21 (×2): 50 ug via INTRAVENOUS

## 2023-10-21 MED ORDER — GABAPENTIN 100 MG PO CAPS
100.0000 mg | ORAL_CAPSULE | ORAL | Status: AC
  Administered 2023-10-21 (×2): 100 mg via ORAL

## 2023-10-21 MED ORDER — BUPIVACAINE-EPINEPHRINE (PF) 0.25% -1:200000 IJ SOLN
INTRAMUSCULAR | Status: AC
  Filled 2023-10-21: qty 30

## 2023-10-21 MED ORDER — PROPOFOL 10 MG/ML IV BOLUS
INTRAVENOUS | Status: DC | PRN
Start: 2023-10-21 — End: 2023-10-21
  Administered 2023-10-21 (×2): 180 mg via INTRAVENOUS

## 2023-10-21 MED ORDER — CHLORHEXIDINE GLUCONATE CLOTH 2 % EX PADS: 6.0000 | MEDICATED_PAD | Freq: Once | CUTANEOUS | Status: DC

## 2023-10-21 MED ORDER — OXYCODONE HCL 5 MG PO TABS: 5.0000 mg | ORAL_TABLET | Freq: Three times a day (TID) | ORAL | 0 refills | Status: AC | PRN

## 2023-10-21 MED ORDER — GLYCOPYRROLATE PF 0.2 MG/ML IJ SOSY
PREFILLED_SYRINGE | INTRAMUSCULAR | Status: AC
  Filled 2023-10-21: qty 1

## 2023-10-21 MED ORDER — BUPIVACAINE-EPINEPHRINE (PF) 0.5% -1:200000 IJ SOLN
INTRAMUSCULAR | Status: DC | PRN
  Administered 2023-10-21 (×2): 30 mL via PERINEURAL

## 2023-10-21 MED ORDER — PHENYLEPHRINE 80 MCG/ML (10ML) SYRINGE FOR IV PUSH (FOR BLOOD PRESSURE SUPPORT)
PREFILLED_SYRINGE | INTRAVENOUS | Status: AC
  Filled 2023-10-21: qty 10

## 2023-10-21 MED ORDER — CEFAZOLIN SODIUM-DEXTROSE 2-4 GM/100ML-% IV SOLN
INTRAVENOUS | Status: AC
  Filled 2023-10-21: qty 100

## 2023-10-21 MED ORDER — OXYCODONE HCL 5 MG PO TABS: 5.0000 mg | ORAL_TABLET | Freq: Once | ORAL | Status: DC | PRN

## 2023-10-21 MED ORDER — ROCURONIUM BROMIDE 100 MG/10ML IV SOLN
INTRAVENOUS | Status: DC | PRN
  Administered 2023-10-21: 10 mg via INTRAVENOUS
  Administered 2023-10-21 (×2): 70 mg via INTRAVENOUS
  Administered 2023-10-21: 10 mg via INTRAVENOUS

## 2023-10-21 MED ORDER — SUGAMMADEX SODIUM 200 MG/2ML IV SOLN
INTRAVENOUS | Status: DC | PRN
  Administered 2023-10-21 (×2): 200 mg via INTRAVENOUS

## 2023-10-21 MED ORDER — LIDOCAINE 2% (20 MG/ML) 5 ML SYRINGE
INTRAMUSCULAR | Status: AC
  Filled 2023-10-21: qty 5

## 2023-10-21 MED ORDER — OXYCODONE HCL 5 MG/5ML PO SOLN: 5.0000 mg | Freq: Once | ORAL | Status: DC | PRN

## 2023-10-21 MED ORDER — MIDAZOLAM HCL 2 MG/2ML IJ SOLN
1.0000 mg | Freq: Once | INTRAMUSCULAR | Status: AC
Start: 2023-10-21 — End: 2023-10-21
  Administered 2023-10-21 (×2): 1 mg via INTRAVENOUS

## 2023-10-21 MED ORDER — ONDANSETRON HCL 4 MG/2ML IJ SOLN: 4.0000 mg | Freq: Once | INTRAMUSCULAR | Status: DC | PRN

## 2023-10-21 MED ORDER — BUPIVACAINE-EPINEPHRINE 0.25% -1:200000 IJ SOLN
INTRAMUSCULAR | Status: DC | PRN
  Administered 2023-10-21 (×2): 17 mL

## 2023-10-21 MED ORDER — PHENYLEPHRINE HCL (PRESSORS) 10 MG/ML IV SOLN
INTRAVENOUS | Status: DC | PRN
  Administered 2023-10-21 (×2): 80 ug via INTRAVENOUS
  Administered 2023-10-21: 160 ug via INTRAVENOUS
  Administered 2023-10-21: 120 ug via INTRAVENOUS
  Administered 2023-10-21: 160 ug via INTRAVENOUS
  Administered 2023-10-21: 80 ug via INTRAVENOUS
  Administered 2023-10-21: 120 ug via INTRAVENOUS
  Administered 2023-10-21: 80 ug via INTRAVENOUS

## 2023-10-21 MED ORDER — GABAPENTIN 100 MG PO CAPS
ORAL_CAPSULE | ORAL | Status: AC
  Filled 2023-10-21: qty 1

## 2023-10-21 MED ORDER — DEXAMETHASONE SODIUM PHOSPHATE 10 MG/ML IJ SOLN
INTRAMUSCULAR | Status: DC | PRN
  Administered 2023-10-21 (×2): 5 mg via INTRAVENOUS

## 2023-10-21 MED ORDER — PROPOFOL 10 MG/ML IV BOLUS
INTRAVENOUS | Status: AC
  Filled 2023-10-21: qty 20

## 2023-10-21 MED ORDER — LIDOCAINE 2% (20 MG/ML) 5 ML SYRINGE
INTRAMUSCULAR | Status: DC | PRN
Start: 2023-10-21 — End: 2023-10-21
  Administered 2023-10-21 (×2): 80 mg via INTRAVENOUS

## 2023-10-21 MED ORDER — EPHEDRINE SULFATE (PRESSORS) 50 MG/ML IJ SOLN
INTRAMUSCULAR | Status: DC | PRN
  Administered 2023-10-21 (×4): 5 mg via INTRAVENOUS
  Administered 2023-10-21: 10 mg via INTRAVENOUS
  Administered 2023-10-21: 5 mg via INTRAVENOUS
  Administered 2023-10-21: 10 mg via INTRAVENOUS
  Administered 2023-10-21: 5 mg via INTRAVENOUS

## 2023-10-21 MED ORDER — CEFAZOLIN SODIUM-DEXTROSE 2-4 GM/100ML-% IV SOLN
2.0000 g | INTRAVENOUS | Status: AC
  Administered 2023-10-21 (×2): 2 g via INTRAVENOUS

## 2023-10-21 MED ORDER — ONDANSETRON HCL 4 MG/2ML IJ SOLN
INTRAMUSCULAR | Status: DC | PRN
  Administered 2023-10-21 (×2): 4 mg via INTRAVENOUS

## 2023-10-21 MED ORDER — ACETAMINOPHEN 500 MG PO TABS
ORAL_TABLET | ORAL | Status: AC
  Filled 2023-10-21: qty 2

## 2023-10-21 MED ORDER — MIDAZOLAM HCL 2 MG/2ML IJ SOLN
INTRAMUSCULAR | Status: AC
  Filled 2023-10-21: qty 2

## 2023-10-21 MED ORDER — LACTATED RINGERS IV SOLN: INTRAVENOUS | Status: DC

## 2023-10-21 SURGICAL SUPPLY — 40 items
BENZOIN TINCTURE PRP APPL 2/3 (GAUZE/BANDAGES/DRESSINGS) IMPLANT
BLADE CLIPPER SURG (BLADE) IMPLANT
BLADE HEX COATED 2.75 (ELECTRODE) ×1 IMPLANT
BLADE SURG 15 STRL LF DISP TIS (BLADE) ×1 IMPLANT
CHLORAPREP W/TINT 26 (MISCELLANEOUS) ×1 IMPLANT
COVER BACK TABLE 60X90IN (DRAPES) ×1 IMPLANT
COVER MAYO STAND STRL (DRAPES) ×2 IMPLANT
DERMABOND ADVANCED .7 DNX12 (GAUZE/BANDAGES/DRESSINGS) ×1 IMPLANT
DRAIN PENROSE .5X12 LATEX STL (DRAIN) ×1 IMPLANT
DRAPE LAPAROTOMY TRNSV 102X78 (DRAPES) ×2 IMPLANT
DRAPE UTILITY XL STRL (DRAPES) ×1 IMPLANT
ELECT COATED BLADE 2.86 ST (ELECTRODE) IMPLANT
ELECTRODE REM PT RTRN 9FT ADLT (ELECTROSURGICAL) ×1 IMPLANT
GLOVE BIO SURGEON STRL SZ7.5 (GLOVE) ×1 IMPLANT
GLOVE BIOGEL PI IND STRL 6.5 (GLOVE) IMPLANT
GLOVE BIOGEL PI IND STRL 8 (GLOVE) IMPLANT
GOWN STRL REUS W/ TWL LRG LVL3 (GOWN DISPOSABLE) ×2 IMPLANT
GOWN STRL REUS W/ TWL XL LVL3 (GOWN DISPOSABLE) IMPLANT
MESH ULTRAPRO 3X6 7.6X15CM (Mesh General) IMPLANT
NDL HYPO 25X1 1.5 SAFETY (NEEDLE) ×1 IMPLANT
NEEDLE HYPO 25X1 1.5 SAFETY (NEEDLE) ×1 IMPLANT
NS IRRIG 1000ML POUR BTL (IV SOLUTION) ×2 IMPLANT
PACK BASIN DAY SURGERY FS (CUSTOM PROCEDURE TRAY) ×1 IMPLANT
PENCIL SMOKE EVACUATOR (MISCELLANEOUS) ×1 IMPLANT
SLEEVE SCD COMPRESS KNEE MED (STOCKING) ×1 IMPLANT
SPIKE FLUID TRANSFER (MISCELLANEOUS) IMPLANT
SPONGE T-LAP 18X18 ~~LOC~~+RFID (SPONGE) ×2 IMPLANT
STRIP CLOSURE SKIN 1/2X4 (GAUZE/BANDAGES/DRESSINGS) IMPLANT
SUT MON AB 4-0 PC3 18 (SUTURE) ×2 IMPLANT
SUT PROLENE 2 0 SH DA (SUTURE) ×4 IMPLANT
SUT SILK 2 0 SH (SUTURE) IMPLANT
SUT SILK 3 0 SH 30 (SUTURE) IMPLANT
SUT SILK 3-0 18XBRD TIE BLK (SUTURE) ×1 IMPLANT
SUT VIC AB 0 CT1 27XBRD ANBCTR (SUTURE) IMPLANT
SUT VIC AB 2-0 SH 27XBRD (SUTURE) ×2 IMPLANT
SUT VIC AB 3-0 SH 27X BRD (SUTURE) ×2 IMPLANT
SUT VICRYL 0 SH 27 (SUTURE) IMPLANT
SYR BULB EAR ULCER 3OZ GRN STR (SYRINGE) ×2 IMPLANT
SYR CONTROL 10ML LL (SYRINGE) ×1 IMPLANT
TOWEL GREEN STERILE FF (TOWEL DISPOSABLE) ×2 IMPLANT

## 2023-10-21 NOTE — Anesthesia Preprocedure Evaluation (Addendum)
 Anesthesia Evaluation  Patient identified by MRN, date of birth, ID band Patient awake    Reviewed: Allergy & Precautions, NPO status , Patient's Chart, lab work & pertinent test results  Airway Mallampati: II       Dental no notable dental hx. (+) Teeth Intact, Dental Advisory Given, Caps   Pulmonary Current Smoker and Patient abstained from smoking.   Pulmonary exam normal breath sounds clear to auscultation       Cardiovascular negative cardio ROS Normal cardiovascular exam Rhythm:Regular Rate:Normal     Neuro/Psych negative neurological ROS  negative psych ROS   GI/Hepatic negative GI ROS, Neg liver ROS,,,  Endo/Other  HLD  Renal/GU negative Renal ROS  negative genitourinary   Musculoskeletal Right Inguinal hernia   Abdominal   Peds  Hematology negative hematology ROS (+)   Anesthesia Other Findings   Reproductive/Obstetrics                              Anesthesia Physical Anesthesia Plan  ASA: 2  Anesthesia Plan: General   Post-op Pain Management: Dilaudid  IV, Precedex, Tylenol  PO (pre-op)* and Regional block*   Induction: Intravenous  PONV Risk Score and Plan: 2 and Treatment may vary due to age or medical condition  Airway Management Planned: LMA  Additional Equipment: None  Intra-op Plan:   Post-operative Plan: Extubation in OR  Informed Consent: I have reviewed the patients History and Physical, chart, labs and discussed the procedure including the risks, benefits and alternatives for the proposed anesthesia with the patient or authorized representative who has indicated his/her understanding and acceptance.     Dental advisory given  Plan Discussed with: CRNA and Anesthesiologist  Anesthesia Plan Comments:          Anesthesia Quick Evaluation

## 2023-10-21 NOTE — Progress Notes (Signed)
 Assisted Dr. Jerrye with right, transabdominal plane, ultrasound guided block. Side rails up, monitors on throughout procedure. See vital signs in flow sheet. Tolerated Procedure well.

## 2023-10-21 NOTE — Transfer of Care (Signed)
 Immediate Anesthesia Transfer of Care Note  Patient: Steven Burgess  Procedure(s) Performed: RIGHT INGUINAL HERNIA REPAIR WITH MESH (Right: Groin)  Patient Location: PACU  Anesthesia Type:GA combined with regional for post-op pain  Level of Consciousness: drowsy and patient cooperative  Airway & Oxygen Therapy: Patient Spontanous Breathing and Patient connected to face mask oxygen  Post-op Assessment: Report given to RN and Post -op Vital signs reviewed and stable  Post vital signs: Reviewed and stable  Last Vitals:  Vitals Value Taken Time  BP    Temp    Pulse 67 10/21/23 12:49  Resp    SpO2 100 % 10/21/23 12:49  Vitals shown include unfiled device data.  Last Pain:  Vitals:   10/21/23 1000  TempSrc: Temporal  PainSc: 0-No pain         Complications: No notable events documented.

## 2023-10-21 NOTE — Anesthesia Postprocedure Evaluation (Signed)
 Anesthesia Post Note  Patient: Steven Burgess  Procedure(s) Performed: RIGHT INGUINAL HERNIA REPAIR WITH MESH (Right: Groin)     Patient location during evaluation: PACU Anesthesia Type: General Level of consciousness: awake and alert and oriented Pain management: pain level controlled Vital Signs Assessment: post-procedure vital signs reviewed and stable Respiratory status: spontaneous breathing, nonlabored ventilation and respiratory function stable Cardiovascular status: blood pressure returned to baseline and stable Postop Assessment: no apparent nausea or vomiting Anesthetic complications: no   No notable events documented.  Last Vitals:  Vitals:   10/21/23 1315 10/21/23 1335  BP: 121/68 131/83  Pulse: 81 72  Resp: 13 16  Temp:  36.7 C  SpO2: 100% 96%    Last Pain:  Vitals:   10/21/23 1335  TempSrc:   PainSc: 0-No pain                 Cheryln Balcom A.

## 2023-10-21 NOTE — Anesthesia Procedure Notes (Signed)
 Anesthesia Regional Block: TAP block   Pre-Anesthetic Checklist: , timeout performed,  Correct Patient, Correct Site, Correct Laterality,  Correct Procedure, Correct Position, site marked,  Risks and benefits discussed,  Surgical consent,  Pre-op evaluation,  At surgeon's request and post-op pain management  Laterality: Right  Prep: chloraprep       Needles:  Injection technique: Single-shot  Needle Type: Echogenic Stimulator Needle     Needle Length: 10cm  Needle Gauge: 21   Needle insertion depth: 8 cm   Additional Needles:   Procedures:,,,, ultrasound used (permanent image in chart),,    Narrative:  Start time: 10/21/2023 11:05 AM End time: 10/21/2023 11:10 AM Injection made incrementally with aspirations every 5 mL.  Performed by: Personally  Anesthesiologist: Jerrye Sharper, MD  Additional Notes: Timeout performed. Patient sedated. Relevant anatomy ID'd using US . Incremental 2-5ml injection of LA with frequent aspiration. Patient tolerated procedure well. ]

## 2023-10-21 NOTE — Progress Notes (Signed)
Assisted Dr. Foster with right, transabdominal plane, ultrasound guided block. Side rails up, monitors on throughout procedure. See vital signs in flow sheet. Tolerated Procedure well. 

## 2023-10-21 NOTE — Interval H&P Note (Signed)
 History and Physical Interval Note:  10/21/2023 10:35 AM  Steven Burgess  has presented today for surgery, with the diagnosis of RIGHT  INGUINAL HERNIA.  The various methods of treatment have been discussed with the patient and family. After consideration of risks, benefits and other options for treatment, the patient has consented to  Procedure(s) with comments: REPAIR, HERNIA, INGUINAL, ADULT (Right) - OPEN RIGHT INGUINAL HERNIA REPAIR WITH MESH as a surgical intervention.  The patient's history has been reviewed, patient examined, no change in status, stable for surgery.  I have reviewed the patient's chart and labs.  Questions were answered to the patient's satisfaction.     Steven Null III

## 2023-10-21 NOTE — H&P (Signed)
 REFERRING PHYSICIAN: Sun, Vyvyan Ye-Lin, MD PROVIDER: DEWARD GARNETTE NULL, MD MRN: [id number] DOB: [birth year] Subjective   Chief Complaint: New Consultation ( INGUINAL HERNIA)  History of Present Illness: Steven Burgess is a 68 y.o. male who is seen today as an office consultation for evaluation of New Consultation ( INGUINAL HERNIA)  We are asked to see the patient in consultation by Dr. Mercer sun to evaluate him for a right inguinal hernia. The patient is a 68 year old white male who has noticed a bulge in the right groin for several months. He is recently started to notice a little discomfort associated with it. He denies any nausea or vomiting. His appetite is good and his bowels are working normally. He is otherwise in good health and does not smoke.  Review of Systems: A complete review of systems was obtained from the patient. I have reviewed this information and discussed as appropriate with the patient. See HPI as well for other ROS.  ROS   Medical History: History reviewed. No pertinent past medical history.  Patient Active Problem List  Diagnosis  Right inguinal hernia   History reviewed. No pertinent surgical history.   No Known Allergies  Current Outpatient Medications on File Prior to Visit  Medication Sig Dispense Refill  rosuvastatin (CRESTOR) 5 MG tablet Take 5 mg by mouth once daily   No current facility-administered medications on file prior to visit.   History reviewed. No pertinent family history.   Social History   Tobacco Use  Smoking Status Never  Passive exposure: Never  Smokeless Tobacco Never    Social History   Socioeconomic History  Marital status: Married  Tobacco Use  Smoking status: Never  Passive exposure: Never  Smokeless tobacco: Never  Vaping Use  Vaping status: Never Used  Substance and Sexual Activity  Alcohol use: Never  Drug use: Never   Social Drivers of Health   Housing Stability: Unknown (09/23/2023)  Housing Stability  Vital Sign  Homeless in the Last Year: No   Objective:   Vitals:  BP: 136/74  Pulse: 80  Temp: 36.4 C (97.5 F)  TempSrc: Oral  SpO2: 98%  Weight: 88.3 kg (194 lb 9.6 oz)  Height: 188 cm (6' 2)  PainSc: 0-No pain   Body mass index is 24.99 kg/m.  Physical Exam Constitutional:  General: He is not in acute distress. Appearance: Normal appearance.  HENT:  Head: Normocephalic and atraumatic.  Right Ear: External ear normal.  Left Ear: External ear normal.  Nose: Nose normal.  Mouth/Throat:  Mouth: Mucous membranes are moist.  Pharynx: Oropharynx is clear.   Eyes:  General: No scleral icterus. Extraocular Movements: Extraocular movements intact.  Conjunctiva/sclera: Conjunctivae normal.  Pupils: Pupils are equal, round, and reactive to light.   Cardiovascular:  Rate and Rhythm: Normal rate and regular rhythm.  Pulses: Normal pulses.  Heart sounds: Normal heart sounds.  Pulmonary:  Effort: Pulmonary effort is normal. No respiratory distress.  Breath sounds: Normal breath sounds.  Abdominal:  General: Abdomen is flat. Bowel sounds are normal. There is no distension.  Palpations: Abdomen is soft.  Tenderness: There is no abdominal tenderness.  Genitourinary: Comments: There is a small to medium size bulge in the right groin that reduces easily. There is a well-healed scar in the left groin from previous testicular biopsy. There seems to be some laxity of the abdominal wall but it is difficult to tell if he has a definite beginnings of a hernia on the left side  Musculoskeletal:  General:  No swelling or deformity. Normal range of motion.  Cervical back: Normal range of motion and neck supple. No tenderness.   Skin: General: Skin is warm and dry.  Coloration: Skin is not jaundiced.   Neurological:  General: No focal deficit present.  Mental Status: He is alert and oriented to person, place, and time.   Psychiatric:  Mood and Affect: Mood normal.  Behavior:  Behavior normal.     Labs, Imaging and Diagnostic Testing:  Assessment and Plan:   Diagnoses and all orders for this visit:  Right inguinal hernia - [facility] Case Posting Request; Future   The patient appears to have a symptomatic right inguinal hernia. Because of the risk of incarceration and strangulation I feel he would benefit from having this fixed. He would also like to have this done. I have discussed with him in detail the risks and benefits of the operation as well as some of the technical aspects including the risk of injury to the vas and testicular vessels as well as chronic pain and the use of mesh and he understands and wishes to proceed. We will move forward with surgical scheduling.

## 2023-10-21 NOTE — Op Note (Signed)
 10/21/2023  12:42 PM  PATIENT:  Steven Burgess  68 y.o. male  PRE-OPERATIVE DIAGNOSIS:  RIGHT  INGUINAL HERNIA  POST-OPERATIVE DIAGNOSIS:  RIGHT INGUINAL HERNIA  PROCEDURE:  Procedure(s) with comments: RIGHT INGUINAL HERNIA REPAIR WITH MESH (Right) - OPEN RIGHT INGUINAL HERNIA REPAIR WITH MESH  SURGEON:  Surgeons and Role:    * Curvin Steven MOULD, MD - Primary  PHYSICIAN ASSISTANT:   ASSISTANTS: Dr. Eva Barrier   ANESTHESIA:   local and general  EBL:  5 mL   BLOOD ADMINISTERED:none  DRAINS: none   LOCAL MEDICATIONS USED:  MARCAINE      SPECIMEN:  No Specimen  DISPOSITION OF SPECIMEN:  N/A  COUNTS:  YES  TOURNIQUET:  * No tourniquets in log *  DICTATION: .Dragon Dictation  After informed consent was obtained the patient was prepped in the operating room and placed in the supine position on the operating table.  After adequate induction of general anesthesia the patient's abdomen the right groin were prepped with ChloraPrep, allowed to dry, and draped in the usual sterile manner.  An appropriate timeout was performed.  The the right groin was then infiltrated with quarter percent Marcaine .  A small incision was made from the edge of the pubic tubercle on the right towards the anterior superior iliac spine with a 15 blade knife.  The incision was carried through the skin and subcutaneous tissue sharply with the electrocautery until the fascia of the external oblique was encountered.  A small bridging vein was clamped with hemostats, divided, and ligated with 3-0 silk ties.  The external oblique fascia was opened along its fibers towards the apex of the external ring with a 15 blade knife and Metzenbaum scissors.  A [place] retractor was deployed.  Blunt dissection was carried out of the cord structures until they could be surrounded between 2 fingers.  A half-inch Penrose drain was then placed around the cord structures for retraction purposes.  The cord was then gently  skeletonized by blunt hemostat dissection.  There was no hernia sac associated with the cord.  There was a broad defect in the floor of the canal medial to the cord.  The hernia sac was easy to reduce.  The floor of the canal was then repaired with multiple figure-of-eight 0 Vicryl stitches. 3 x 6 piece of ultra Pro mesh was chosen and cut to the appropriate size.  The mesh was sewed inferiorly to the shelving edge of the inguinal ligament with a running 2-0 Prolene stitch.  Tails were cut in the mesh laterally and the tails were wrapped around the cord structures.  Medially and superiorly the mesh was sewn to the muscular aponeurotic strength of the transversalis with interrupted 2-0 Prolene vertical mattress stitches.  Lateral to the cord the tails of the mesh were also anchored to the shelving edge of the inguinal ligament with interrupted 2-0 Prolene stitches.  Once this was accomplished the mesh appeared to be in good position and the hernia seem well repaired.  The wound was irrigated with copious amounts of saline.  During the dissection the ilioinguinal nerve was identified and involved with some scar tissue.  It was dissected out proximally and distally, clamped, divided, and ligated with 3-0 silk ties.  Next the external oblique fascia was reapproximated with a running 2-0 Vicryl stitch.  The subcutaneous fascia was closed with a running 3-0 Vicryl stitch.  The skin was closed with a running 4-0 Monocryl subcuticular stitch.  Dermabond dressings were applied.  The patient tolerated the procedure well.  At the end of the case all needle sponge and instrument counts were correct.  The patient was then awakened and taken to recovery in stable condition.  The patient's testicle was in his scrotum at the end of the case.  PLAN OF CARE: Discharge to home after PACU  PATIENT DISPOSITION:  PACU - hemodynamically stable.   Delay start of Pharmacological VTE agent (>24hrs) due to surgical blood loss or risk  of bleeding: not applicable

## 2023-10-21 NOTE — Anesthesia Procedure Notes (Signed)
 Procedure Name: Intubation Date/Time: 10/21/2023 11:28 AM  Performed by: Debarah Chiquita LABOR, CRNAPre-anesthesia Checklist: Patient identified, Emergency Drugs available, Suction available and Patient being monitored Patient Re-evaluated:Patient Re-evaluated prior to induction Oxygen Delivery Method: Circle system utilized Preoxygenation: Pre-oxygenation with 100% oxygen Induction Type: IV induction Ventilation: Mask ventilation without difficulty Laryngoscope Size: Mac and 4 Grade View: Grade I Tube type: Oral Tube size: 7.5 mm Number of attempts: 1 Airway Equipment and Method: Stylet and Bite block Placement Confirmation: ETT inserted through vocal cords under direct vision, positive ETCO2 and breath sounds checked- equal and bilateral Secured at: 23 cm Tube secured with: Tape Dental Injury: Teeth and Oropharynx as per pre-operative assessment

## 2023-10-21 NOTE — Discharge Instructions (Signed)
 No Tylenol  until 4:00 pm today, if needed.

## 2023-10-22 ENCOUNTER — Encounter (HOSPITAL_BASED_OUTPATIENT_CLINIC_OR_DEPARTMENT_OTHER): Payer: Self-pay | Admitting: General Surgery

## 2023-11-14 DIAGNOSIS — H43813 Vitreous degeneration, bilateral: Secondary | ICD-10-CM | POA: Diagnosis not present

## 2023-12-16 DIAGNOSIS — Z23 Encounter for immunization: Secondary | ICD-10-CM | POA: Diagnosis not present

## 2024-02-18 ENCOUNTER — Ambulatory Visit: Admitting: Dermatology
